# Patient Record
Sex: Female | Born: 1955 | Race: White | Hispanic: No | Smoking: Never smoker
Health system: Southern US, Community
[De-identification: ages and names within clinical notes are randomized; demographics above are authoritative.]

## PROBLEM LIST (undated history)

## (undated) DIAGNOSIS — E785 Hyperlipidemia, unspecified: Secondary | ICD-10-CM

## (undated) DIAGNOSIS — E119 Type 2 diabetes mellitus without complications: Secondary | ICD-10-CM

## (undated) DIAGNOSIS — I10 Essential (primary) hypertension: Secondary | ICD-10-CM

## (undated) DIAGNOSIS — G5600 Carpal tunnel syndrome, unspecified upper limb: Secondary | ICD-10-CM

## (undated) DIAGNOSIS — K219 Gastro-esophageal reflux disease without esophagitis: Secondary | ICD-10-CM

## (undated) HISTORY — PX: COLONOSCOPY: SHX174

## (undated) HISTORY — PX: TONSILLECTOMY: SUR1361

## (undated) HISTORY — PX: UPPER GASTROINTESTINAL ENDOSCOPY: SHX188

---

## 1997-08-11 ENCOUNTER — Ambulatory Visit (HOSPITAL_COMMUNITY): Admission: RE | Admit: 1997-08-11 | Discharge: 1997-08-11 | Payer: Self-pay | Admitting: Obstetrics and Gynecology

## 1998-04-17 ENCOUNTER — Other Ambulatory Visit: Admission: RE | Admit: 1998-04-17 | Discharge: 1998-04-17 | Payer: Self-pay | Admitting: Obstetrics and Gynecology

## 1998-05-27 HISTORY — PX: TOTAL ABDOMINAL HYSTERECTOMY W/ BILATERAL SALPINGOOPHORECTOMY: SHX83

## 1998-07-03 ENCOUNTER — Inpatient Hospital Stay (HOSPITAL_COMMUNITY): Admission: RE | Admit: 1998-07-03 | Discharge: 1998-07-05 | Payer: Self-pay | Admitting: Obstetrics and Gynecology

## 1999-08-14 ENCOUNTER — Other Ambulatory Visit: Admission: RE | Admit: 1999-08-14 | Discharge: 1999-08-14 | Payer: Self-pay | Admitting: Obstetrics and Gynecology

## 1999-10-10 ENCOUNTER — Encounter: Admission: RE | Admit: 1999-10-10 | Discharge: 2000-01-08 | Payer: Self-pay | Admitting: *Deleted

## 2000-10-28 ENCOUNTER — Other Ambulatory Visit: Admission: RE | Admit: 2000-10-28 | Discharge: 2000-10-28 | Payer: Self-pay | Admitting: Obstetrics and Gynecology

## 2001-05-27 HISTORY — PX: DIAGNOSTIC LAPAROSCOPY: SUR761

## 2001-08-27 ENCOUNTER — Inpatient Hospital Stay (HOSPITAL_COMMUNITY): Admission: RE | Admit: 2001-08-27 | Discharge: 2001-09-01 | Payer: Self-pay | Admitting: Obstetrics and Gynecology

## 2002-07-06 ENCOUNTER — Other Ambulatory Visit: Admission: RE | Admit: 2002-07-06 | Discharge: 2002-07-06 | Payer: Self-pay | Admitting: Obstetrics and Gynecology

## 2003-07-26 ENCOUNTER — Other Ambulatory Visit: Admission: RE | Admit: 2003-07-26 | Discharge: 2003-07-26 | Payer: Self-pay | Admitting: Obstetrics and Gynecology

## 2004-03-21 ENCOUNTER — Encounter: Admission: RE | Admit: 2004-03-21 | Discharge: 2004-03-21 | Payer: Self-pay | Admitting: Gastroenterology

## 2004-04-04 ENCOUNTER — Ambulatory Visit (HOSPITAL_COMMUNITY): Admission: RE | Admit: 2004-04-04 | Discharge: 2004-04-04 | Payer: Self-pay | Admitting: Gastroenterology

## 2004-04-04 ENCOUNTER — Encounter (INDEPENDENT_AMBULATORY_CARE_PROVIDER_SITE_OTHER): Payer: Self-pay | Admitting: *Deleted

## 2009-07-18 ENCOUNTER — Encounter: Payer: Self-pay | Admitting: Cardiology

## 2009-07-24 ENCOUNTER — Encounter: Payer: Self-pay | Admitting: Cardiology

## 2009-07-27 ENCOUNTER — Ambulatory Visit: Payer: Self-pay | Admitting: Cardiology

## 2009-07-27 DIAGNOSIS — I10 Essential (primary) hypertension: Secondary | ICD-10-CM | POA: Insufficient documentation

## 2009-07-27 DIAGNOSIS — R079 Chest pain, unspecified: Secondary | ICD-10-CM | POA: Insufficient documentation

## 2009-07-27 DIAGNOSIS — E785 Hyperlipidemia, unspecified: Secondary | ICD-10-CM

## 2009-08-23 ENCOUNTER — Ambulatory Visit (HOSPITAL_COMMUNITY): Admission: RE | Admit: 2009-08-23 | Discharge: 2009-08-23 | Payer: Self-pay | Admitting: Cardiology

## 2009-08-23 ENCOUNTER — Ambulatory Visit: Payer: Self-pay | Admitting: Cardiology

## 2009-08-23 ENCOUNTER — Encounter: Payer: Self-pay | Admitting: Cardiology

## 2009-08-23 ENCOUNTER — Ambulatory Visit: Payer: Self-pay

## 2009-08-23 ENCOUNTER — Ambulatory Visit: Payer: Self-pay | Admitting: Internal Medicine

## 2010-06-28 NOTE — Letter (Signed)
Summary: [facility] Office Note  [facility] Office Note   Imported By: Roderic Ovens 08/11/2009 15:24:14  _____________________________________________________________________  External Attachment:    Type:   Image     Comment:   External Document

## 2010-06-28 NOTE — Assessment & Plan Note (Signed)
Summary: f/u stress echo   Primary Provider:  Dr. Wylene Simmer   History of Present Illness: 55 yo with history of HTN, diabetes, and hyperlipidemia presented initially for cardiac evaluation.  Patient had not been on a statin but recent labs showed LDL 175 and triglyerices 343, so she was started recently on pitavastatin, which she has tolerated well so far.  BP is well-controlled.  Patient walks for exercise.  With brisk walking she is short of breath.  No chest pain or tightness.  No shortness of breath with steps. Nonsmoker.  Of note, patient's father had an MI in his 29s.    Given risk factors and some mild exertional dyspnea, I had her do a stress echo for risk stratification, which was a normal study.   Labs (2/11): K 3.8, creatinine 0.9, TGs 343, LDL 175, HDL 41  Current Medications (verified): 1)  Hydrochlorothiazide 12.5 Mg Caps (Hydrochlorothiazide) .... Take One Capsule Once Daily 2)  Ramipril 10 Mg Caps (Ramipril) .... Take One Capsule Daily 3)  Lantus 100 Unit/ml Soln (Insulin Glargine) .... Use 40 Units Daily 4)  Byetta 10 Mcg Pen 10 Mcg/0.63ml Soln (Exenatide) .... Two Times A Day 5)  Livalo 4 Mg Tabs (Pitavastatin Calcium) .... One Tablet Daily 6)  Fish Oil 2100mg  .... One Daily 7)  Aspir-Low 81 Mg Tbec (Aspirin) .... One Daily 8)  Prevacid 24hr 15 Mg Cpdr (Lansoprazole) .... One Daily  Allergies: No Known Drug Allergies  Past History:  Past Medical History: 1. GERD 2. Diabetes mellitus 3. Hyperlipidemia 4. HTN 5. FIbrocystic breast disease 6. Colonic adenoma 7. TAH/BSO 2000 (fibroids) 8. Obesity 9. Stress Echo (3/11): Normal augmentation of all walls with stress, no indication of ischemia or infarction.   Family History: Reviewed history from 07/27/2009 and no changes required. Father with MI in his 54s.   Social History: Reviewed history from 07/27/2009 and no changes required. Divorced.  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   Lives in [place], works in [place].   Vital Signs:  Patient profile:   55 year old female Height:      67 inches Weight:      248.75 pounds Pulse (ortho):   80 / minute BP sitting:   126 / 78  (left arm)  Vitals Entered By: Katina Dung, RN, BSN (August 23, 2009 11:53 AM)  Physical Exam  General:  Well developed, well nourished, in no acute distress.  Obese.  Neck:  Neck supple, no JVD. No masses, thyromegaly or abnormal cervical nodes. Lungs:  Clear bilaterally to auscultation and percussion. Heart:  Non-displaced PMI, chest non-tender; regular rate and rhythm, S1, S2 without murmurs, rubs or gallops. Carotid upstroke normal, no bruit. Pedals normal pulses. No edema, no varicosities. Abdomen:  Bowel sounds positive; abdomen soft and non-tender without masses, organomegaly, or hernias noted. No hepatosplenomegaly. Extremities:  No clubbing or cyanosis. Neurologic:  Alert and oriented x 3. Psych:  Normal affect.   Impression & Recommendations:  Problem # 1:  CARDIAC RISK Stress echo was a low risk study, no evidence for ischemia or infarction.  Patient's main risk factors are hyperlipidemia, HTN, and family history of premature CAD.  HTN is well-controlled and patient has started on a statin.   Patient Instructions: 1)  Your physician recommends that you schedule a follow-up appointment as needed with Dr Shirlee Latch.

## 2010-06-28 NOTE — Assessment & Plan Note (Signed)
Summary: np6/cardiac eval/hx of premature CAD/Early family hx/jml   Primary Provider:  Dr. Wylene Simmer  CC:  new patient cardiac evaluation/hx of premature CAD in family.  Marland Kitchen  History of Present Illness: 55 yo with history of HTN, diabetes, and hyperlipidemia presents for cardiac evaluation.  Patient had not been on a statin but recent labs showed LDL 175 and triglyerices 343, so she as been started recently on Tricor and pitivastatin.  Blood pressure is reasonably controlled.  Patient walks for exercise.  With brisk walking she is short of breath.  No chest pain or tightness.  No shortness of breath with steps. Nonsmoker.  Of note, patient's father had an MI in his 64s.    ECG: NSR, poor anterior R wave progression.  Labs (2/11): K 3.8, creatinine 0.9, TGs 343, LDL 175, HDL 41   Current Medications (verified): 1)  Hydrochlorothiazide 12.5 Mg Caps (Hydrochlorothiazide) .... Take One Capsule Once Daily 2)  Ramipril 10 Mg Caps (Ramipril) .... Take One Capsule Daily 3)  Lantus 100 Unit/ml Soln (Insulin Glargine) .... Use 40 Units Daily 4)  Byetta 10 Mcg Pen 10 Mcg/0.77ml Soln (Exenatide) .... Two Times A Day 5)  Tricor 145 Mg Tabs (Fenofibrate) .... Take One Tablet Once Daily 6)  Livalo 2 Mg Tabs (Pitavastatin Calcium) .... Once Daily  Allergies (verified): No Known Drug Allergies  Past History:  Past Medical History: 1. GERD 2. Diabetes mellitus 3. Hyperlipidemia 4. HTN 5. FIbrocystic breast disease 6. Colonic adenoma 7. TAH/BSO 2000 (fibroids) 8. Obesity  Family History: Father with MI in his 30s.   Social History: Divorced.  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  Lives in [place], works in [place].   Review of Systems       All systems reviewed and negative except as per HPI.   Vital Signs:  Patient profile:   55 year old female Height:      67 inches Weight:      251 pounds BMI:     39.45 Pulse rate:   78 / minute Pulse rhythm:    regular BP sitting:   135 / 84  (left arm) Cuff size:   large  Vitals Entered By: Judithe Modest CMA (July 27, 2009 10:13 AM)  Physical Exam  General:  Well developed, well nourished, in no acute distress.  Obese.  Head:  normocephalic and atraumatic Nose:  no deformity, discharge, inflammation, or lesions Mouth:  Teeth, gums and palate normal. Oral mucosa normal. Neck:  Neck supple, no JVD. No masses, thyromegaly or abnormal cervical nodes. Lungs:  Clear bilaterally to auscultation and percussion. Heart:  Non-displaced PMI, chest non-tender; regular rate and rhythm, S1, S2 without murmurs, rubs or gallops. Carotid upstroke normal, no bruit. Pedals normal pulses. No edema, no varicosities. Abdomen:  Bowel sounds positive; abdomen soft and non-tender without masses, organomegaly, or hernias noted. No hepatosplenomegaly. Msk:  Back normal, normal gait. Muscle strength and tone normal. Extremities:  No clubbing or cyanosis. Neurologic:  Alert and oriented x 3. Skin:  Intact without lesions or rashes. Psych:  Normal affect.   Impression & Recommendations:  Problem # 1:  CORONARY DISEASE RISK Patient has a number of risk factors for CAD: HTN, hyperlipidemia, diabetes, and family history of premature CAD. She should take ASA 81 mg daily (which I recommended to her).  She is short of breath with more heavy exertion (walking fast).  Given her risk factors, I think a stress echo for risk stratification  would be reasonable.   Problem # 2:  HYPERLIPIDEMIA-MIXED (ICD-272.4) Start Tricor and pitivastatin per Dr. Wylene Simmer.  I also suggested 2000 mg fish oil daily.   Problem # 3:  HYPERTENSION, UNSPECIFIED (ICD-401.9) Continue HCTZ and ramipril.  Reasonable numbers today.   Other Orders: Stress Echo (Stress Echo)  Patient Instructions: 1)  Your physician has recommended you make the following change in your medication:   2)  Start 81 mg ASA; take 1 tablet once daily  3)  Start fish oil 1000  mg; take 1 capsule two times a day  4)  Your physician has requested that you have a stress echocardiogram. For further information please visit [email or web address]  Please follow instruction sheet as given. 5)  Schedule follow up appt with Dr. Shirlee Latch following Echo

## 2010-10-12 NOTE — Discharge Summary (Signed)
[facility]  Patient:    Erin Watts, Erin Watts Visit Number: [id number] MRN: [id number]          Service Type: GYN Location: 9300 [id number] Attending Physician:  Osborn Coho Dictated by:   Janeece Riggers Dareen Piano, M.D. Admit Date:  08/27/2001 Discharge Date: 09/01/2001                             Discharge Summary  DISCHARGE DIAGNOSES:          1. Pelvic pain.                               2. History of endometriosis.                               3. Extensive abdominal and pelvic adhesions.                               4. Laceration from trocar to small bowel.  PROCEDURES:                   1. Diagnostic laparoscopy.                               2. Exploratory laparotomy with enterolysis.                               3. Repair of trocar injury to ileum.  HISTORY OF PRESENT ILLNESS:   [name] is a 55 year old white female who presented to the hospital for diagnostic laparoscopy secondary to a one-year history of worsening pelvic pain.  The patient has a history of hysterectomy. During that time, endometriosis was discovered.  The patient did well from her hysterectomy until approximately seven to eight months ago when she began to develop pelvic pain.  At that time, the patient was given a course of Lupron which did not change her symptoms.  In light of this, she was taken to the operating room for a diagnostic laparoscopy.  For more detailed description of the events which led up to this can be found in the dictated History & Physical.  HOSPITAL COURSE:              The patient was undergoing a diagnostic laparoscopy when she was discovered to have an injury to bowel from a trocar placement.  Because of this, the patient had vertical skin incision made and exploratory laparotomy performed.  There were extensive adhesions involving the large and small bowel throughout the abdomen and pelvis.  These were all taken down with sharp dissection.  A  general surgeon was called.  Dr. Ezzard Standing presented to the operating room and repaired the laceration and also completed the enterolysis.  Once this was accomplished, Dr. Ezzard Standing left, and the vertical skin incision was closed using a running Smead-Jones, and staples were used to close the skin.  The patient did well postoperatively.  Initially and NG tube was placed.  Once bowel sounds were heard, she was started on a clear liquid diet.  The patient was following the diet well, and on postoperative day #6, the patient was afebrile.  She was discharged  to home and instructed to follow up in the office in two weeks.Dictated by:   Janeece Riggers Dareen Piano, M.D. Attending Physician:  Osborn Coho DD:  09/22/01 TD:  09/23/01 Job: [id number]

## 2010-10-12 NOTE — H&P (Signed)
[facility]  Patient:    Erin Watts, Erin Watts Visit Number: [id number] MRN: [id number]          Service Type: GYN Location: 9300 9313 01 Attending Physician:  Osborn Coho Dictated by:   Janeece Riggers Dareen Piano, M.D. Admit Date:  08/27/2001                           History and Physical  HISTORY OF PRESENT ILLNESS:   [name] is a 55 year old white female G0, P0 status post total abdominal hysterectomy with bilateral salpingo-oophorectomy who presented to the operative suite for a diagnostic laparoscopy, possible laparotomy secondary to chronic pelvic pain with associated low-back pain. The patient underwent total abdominal hysterectomy with bilateral salpingo-oophorectomy and Burch urethropexy in February 2000 secondary to symptomatic uterine fibroids and stress urinary incontinence.  At the time, the patient was found to have endometriosis which was treated.  The amount of endometriosis was minimal.  It was on the left pelvic sidewall. Postoperatively, the patient did well for approximately 7-8 months, at which time she began complaining of lower abdominal pain and left lower quadrant pain.  The patient was felt to have a recurrence of her endometriosis and her hormone replacement therapy was discontinued.  She was also put on Lupron for six months.  The patient had minimal improvement with this and returned to the office in March 2003 complaining of worsening of the lower abdominal pain and low-back pain.  She also states that she feels like she is going to start her menstrual cycle.  She has also dyspareunia.  She denies any abnormal bowel function.  The patient also states she occasionally gets pain going down her left lower extremity.  The patient did have a trial of ______, which made no difference in her symptoms.  It was felt that the patient possibly has deep spots of endometriosis or pelvic adhesions creating her complications.   The patient understands that she may have to undergo exploratory laparotomy if the endometriosis is impossible to get to through the laparoscopy.  PAST MEDICAL HISTORY:         Surgery as mentioned above.  The patient also has a history of type 2 diabetes.  ALLERGIES:                    She has no known medical allergies.  SOCIAL HISTORY:               She denies smoking.  She uses alcohol occasionally.  FAMILY HISTORY:               Significant for colon carcinoma.  PHYSICAL EXAMINATION:  VITAL SIGNS:                  The patients vital signs are stable and she is afebrile.  HEENT:                        Within normal limits.  LUNGS:                        Clear to auscultation.  CARDIOVASCULAR:               Regular rate and rhythm without murmur.  BREASTS:                      Without masses or tenderness.  No  lymphadenopathy.  ABDOMEN:                      Soft, nontender, nondistended.  There is no organomegaly.  There is no rebound or guarding.  EXTREMITIES:                  Within normal limits.  PELVIC:                       Exam reveals a normal external female genitalia. The vagina is without discharge or lesions.  There is some tenderness at the cuff.  No masses are palpated.  IMPRESSION:                   Chronic pelvic pain, possible recurrence of endometriosis.  PLAN:                         Proceed with diagnostic laparoscopy, possible exploratory laparotomy. Dictated by:   Janeece Riggers Dareen Piano, M.D. Attending Physician:  Osborn Coho DD:  08/27/01 TD:  08/27/01 Job: [id number]

## 2010-10-12 NOTE — Op Note (Signed)
[facility]  Patient:    Erin Watts, Erin Watts Visit Number: [id number] MRN: [id number]          Service Type: GYN Location: 9300 9313 01 Attending Physician:  Osborn Coho Dictated by:   Janeece Riggers Dareen Piano, M.D. Proc. Date: 08/27/01 Admit Date:  08/27/2001   CC:         Sandria Bales. Ezzard Standing, M.D.   Operative Report  PREOPERATIVE DIAGNOSES:       1. Pelvic pain.                               2. History of endometriosis.                               3. Laceration of ileum with trocar.  POSTOPERATIVE DIAGNOSES:      1. Pelvic pain.                               2. History of endometriosis.                               3. Laceration of ileum with trocar.  PROCEDURE:                    1. Diagnostic laparoscopy.                               2. Lysis of adhesions.                               3. Exploratory laparotomy.                               4. Repair of laceration of ileum.  SURGEON:                      Mark E. Dareen Piano, M.D., Brook A. Edward Jolly, M.D., Ovidio Kin, M.D.  ANESTHESIA:                   General endotracheal.  ANTIBIOTICS:                  Ancef 1 g, Flagyl 2 g IV piggyback.  ESTIMATED BLOOD LOSS:         150 cc.  DRAINS:                       Foley to bed side drainage.  COMPLICATIONS:                Patient had injury to her ileum approximately 18 inches from the cecum.  This was made with a 10 mm trocar.  PROCEDURE:                    Patient was taken to the operating room where she was placed in a dorsal supine position.  A general anesthetic was administered without complications.  She was then placed in a dorsal lithotomy position and prepped with Hibiclens.  Her bladder was drained with a Red rubber catheter.  Her umbilicus was  then injected with 0.25% Marcaine.  A vertical skin incision was made in the umbilicus.  A Veress needle was placed into the peritoneal cavity and 3 L of carbon dioxide was then placed.   The trocar was then placed in the peritoneal cavity.  On attempt to place the 10 mm trocar in the peritoneal cavity was unsuccessful.  It was felt that the fascia had been perforated; however, we were unable to get into the peritoneal cavity.  At this point the scope was placed through the trocar sheath and small window in the perineum was found where the scope was placed through the peritoneum and into the peritoneal cavity.  At this point there were extensive adhesions found throughout the entire abdominal cavity.  These were taken down with sharp dissection.  Patient was found to have an injury to her bowel at the trocar site.  No stool was seen coming from this; however, mucosa was visualized.  It was impossible to tell whether the patients large or small bowel had been injured.  Because of this the patient was set up for an exploratory laparotomy.  The scope was removed and pneumoperitoneum released and the fascia closed with 0 Vicryl suture and the skin closed with 4-0 Vicryl suture.  At this point a vertical skin incision was made.  This was carried up to just below the umbilicus.  The fascia was then entered in the midline and taken superiorly and inferiorly.  The parietoperitoneum was entered sharply and omental adhesions taken down with the Bovie.  At this point adhesions involving the bowel were taken down with the Metzenbaum scissors.  The area of injury was isolated and adhesions involving this portion of the bowel were all taken down with sharp dissection.  Once this was accomplished it was found that the injury was in the ileum approximately 18 inches from the cecum. General surgeon was contacted to verify this and Dr. Ezzard Standing presented to the operating room.  An OConnor-O-Sullivan retractor had been placed and the bowel run in both directions.  No other injuries were identified.  When Dr. Ezzard Standing arrived the injury was closed with interrupted 3-0 silk sutures.  A complete  description of this can be found in his dictated operative note. Following this extensive adhesions in the pelvic cavity were taken down with Metzenbaum scissors by Dr. Ezzard Standing.  No evidence of endometriosis was found throughout the entire pelvis or abdominal cavity.  The upper abdominal cavity was also explored and found to be normal.  This concluded the procedure. Patient did have an NG tube placed just prior to exploratory laparotomy along with a Foley catheter.  The patient was also given 2 g of Flagyl once the injury was noted.  The fascia and peritoneum were closed using 0 PDS suture in a running Smead Jones fashion.  Subcuticular tissue was closed using interrupted 2-0 clip sutures.  Stainless steel clips were used to close the skin.  The patient tolerated her procedure well.  She was taken to the recovery room in stable condition.  She will be admitted and begun on Cefotan. She will be made n.p.o. Dictated by:   Janeece Riggers. Dareen Piano, M.D. Attending Physician:  Osborn Coho DD:  08/27/01 TD:  08/27/01 Job: [id number]

## 2010-10-12 NOTE — Op Note (Signed)
[facility]  Patient:    Erin Watts, Erin Watts Visit Number: [id number] MRN: [id number]          Service Type: GYN Location: 9300 9313 01 Attending Physician:  Osborn Coho Dictated by:   Sandria Bales. Ezzard Standing, M.D. Proc. Date: 08/27/01 Admit Date:  08/27/2001   CC:         Loraine Leriche E. Dareen Piano, M.D.   Operative Report  DATE OF BIRTH:  02/11/1956  PREOPERATIVE DIAGNOSIS:  Small bowel enterotomy.  POSTOPERATIVE DIAGNOSIS:  Small bowel enterotomy.  Intra-abdominal adhesions.  OPERATION PERFORMED:  Closure of small bowel enterotomy and lysis of pelvic adhesions.  SURGEON:  Sandria Bales. Ezzard Standing, M.D.  ASSISTANT:  Janeece Riggers. Dareen Piano, M.D.  ANESTHESIA:  General endotracheal.  INDICATIONS FOR PROCEDURE:  The patient is a 55 year old female who was undergoing a laparoscopy by Dr. Malva Limes for left-sided and pelvic pain. She had a prior transabdominal hysterectomy.  Upon entering the bowel, there was an injury to the small bowel with the trocar.  I was consulted intraoperatively.  Dr. Dareen Piano had already done open laparotomy on the patient and identified the injury.  DESCRIPTION OF PROCEDURE:  At about 18 inches from the terminal ileum the patient had about a 1.5 cm tear in the small bowel with a hole in to the bowel.  The bowel looked viable, did not look overly injured and I closed this hole with interrupted 3-0 silk sutures.  Also I buttressed an area right adjacent to it where there was a small serosal tear.  We then ran the bowel down to the terminal ileum which was unremarkable and back up to the jejunum. The remainder of the bowel was free of any adhesions.  She also had some adhesions of the sigmoid colon down along the left colonic gutter.  We took these down looking for any evidence of endometriosis or other abnormality and we found none.  The sigmoid colon coiled actually back on itself a little bit in the pelvis.  At this point I  completed my part of the procedure.  Dr. Dareen Piano was going to close the abdomen.  I will follow the patient in the hospital.  We will keep her n.p.o. for at least a day or two to make sure she has no problems with this. Dictated by:   Sandria Bales. Ezzard Standing, M.D. Attending Physician:  Osborn Coho DD:  08/27/01 TD:  08/27/01 Job: [id number]

## 2010-10-12 NOTE — Op Note (Signed)
[name], Erin Watts                ACCOUNT NO.:  [id number]   MEDICAL RECORD NO.:  [id number]          PATIENT TYPE:  AMB   LOCATION:  ENDO                         FACILITY:  [facility]   PHYSICIAN:  Erin Watts, M.D.    DATE OF BIRTH:  05/09/1956   DATE OF PROCEDURE:  DATE OF DISCHARGE:                                 OPERATIVE REPORT   DATE OF PROCEDURE:  April 04, 2004.   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  Consent was signed after risks, benefits, methods, options  thoroughly discussed in the office.   MEDICINES USES:  Demerol 90, Versed 9.   ENDOSCOPIST:  Erin Kuba, MD.   PROCEDURE:  Rectal inspection was pertinent for external hemorrhoids, small.  Digital exam was negative.  Video colonoscope was inserted and with  abdominal pressure and rolling her around on her back, we were easily able  to advance to the cecum.  On insertion, no obvious abnormalities were seen.  The cecum was identified by the appendiceal orifice and the ileocecal valve.  In fact, the scope was inserted a short ways into the terminal ileum, which  was normal.  Photo documentation was obtained, and the scope was slowly  withdrawn.  Prep was adequate.  No abnormality was seen in the cecum and the  ascending.  There was some liquid stool that required washing and  suctioning.  On slow withdrawal in the more proximal transverse, a small,  sessile polyp was seen and was hot biopsied x3 and put in the first  container.  As the scope was slowly withdrawn, another two transverse tiny  to small polyps were seen as well as a few descending and sigmoid, and they  were all hot biopsied and put in the second container.  There was a rare  left-sided diverticula, but no other abnormalities.  Once back in the  rectum, two  small polyps were seen, and both were snared.  One,  unfortunately, the snare cut through the polyp.  Both polyps were suctioned  through the scope and collected in the trap, but the one that we cut  through, we hot biopsied the base with good cessation of bleeding.  The  other polyp was snared in the usual fashion, electrocautery applied, and the  polyp removed.  There was a nice, white __________without any obvious  residual bleeding.  Anorectal pull-through and retroflexion confirms some  small hemorrhoids.  The scope was straightened and readvanced a short ways  up the left side of the colon.  The rectal polyps were put in a third  container, air was suctioned, the scope removed.  The patient tolerated the  procedure well.  There was no obvious immediate complication.   ENDOSCOPIC DIAGNOSES:  1.  Internal and external hemorrhoids.  2.  Rare left-sided diverticula.  3.  Two rectal polyps snared, and one hot biopsied at the base and  put in      container #3.  4.  One small proximal transverse polyp, sessile, 3 hot biopsies, put in the      first container.  5.  Multiple, tiny, transverse, descending and sigmoid polyps, hot biopsied      and put in container #2.  6.  Otherwise within normal limits to the terminal ileum.   PLAN:  Await pathology to determine future colonic screening.  Consider a  laparoscope by Dr. Dareen Watts.  Happy to see back p.r.n. or in two or three  months to recheck symptoms to make sure no further workup plans are needed.  Possible other antispasmodic such as Carafate, etc., to help with her  symptoms.       MEM/MEDQ  D:  04/04/2004  T:  04/04/2004  Job:  [id number]   cc:   Erin Watts, M.D.  [address]  [place]  [place] [address]  Fax: [phone]   Erin Watts, M.D.  [address]  [place]  [place] [address]  Fax: [phone]

## 2011-06-06 ENCOUNTER — Ambulatory Visit (INDEPENDENT_AMBULATORY_CARE_PROVIDER_SITE_OTHER): Payer: BC Managed Care – PPO | Admitting: *Deleted

## 2011-06-06 DIAGNOSIS — M7989 Other specified soft tissue disorders: Secondary | ICD-10-CM

## 2011-06-07 ENCOUNTER — Encounter: Payer: Self-pay | Admitting: Family Medicine
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
## 2012-05-14 ENCOUNTER — Encounter (INDEPENDENT_AMBULATORY_CARE_PROVIDER_SITE_OTHER): Payer: BC Managed Care – PPO | Admitting: *Deleted

## 2012-05-14 DIAGNOSIS — R0989 Other specified symptoms and signs involving the circulatory and respiratory systems: Secondary | ICD-10-CM

## 2013-01-06 ENCOUNTER — Other Ambulatory Visit: Payer: Self-pay | Admitting: Internal Medicine

## 2013-01-06 DIAGNOSIS — Z1231 Encounter for screening mammogram for malignant neoplasm of breast: Secondary | ICD-10-CM

## 2013-01-14 ENCOUNTER — Ambulatory Visit
Admission: RE | Admit: 2013-01-14 | Discharge: 2013-01-14 | Disposition: A | Discharge status: Home / Self Care | Payer: BC Managed Care – PPO | Source: Ambulatory Visit | Attending: Internal Medicine | Admitting: Internal Medicine

## 2013-01-14 DIAGNOSIS — Z1231 Encounter for screening mammogram for malignant neoplasm of breast: Secondary | ICD-10-CM

## 2013-03-23 ENCOUNTER — Other Ambulatory Visit: Payer: Self-pay | Admitting: Orthopedic Surgery

## 2013-03-25 ENCOUNTER — Encounter (HOSPITAL_BASED_OUTPATIENT_CLINIC_OR_DEPARTMENT_OTHER): Payer: Self-pay | Admitting: *Deleted

## 2013-03-25 NOTE — Progress Notes (Signed)
Will come in for bmet-ekg-n cardiac problems

## 2013-03-25 NOTE — Progress Notes (Signed)
03/25/13 1655  OBSTRUCTIVE SLEEP APNEA  Have you ever been diagnosed with sleep apnea through a sleep study? No  Do you snore loudly (loud enough to be heard through closed doors)?  1  Do you often feel tired, fatigued, or sleepy during the daytime? 0  Has anyone observed you stop breathing during your sleep? 0  Do you have, or are you being treated for high blood pressure? 1  BMI more than 35 kg/m2? 1  Age over 58 years old? 1  Gender: 0  Obstructive Sleep Apnea Score 4  Score 4 or greater  Results sent to PCP

## 2013-03-29 ENCOUNTER — Encounter (HOSPITAL_BASED_OUTPATIENT_CLINIC_OR_DEPARTMENT_OTHER)
Admission: RE | Admit: 2013-03-29 | Discharge: 2013-03-29 | Disposition: A | Discharge status: Home / Self Care | Payer: BC Managed Care – PPO | Source: Ambulatory Visit | Attending: Orthopedic Surgery | Admitting: Orthopedic Surgery

## 2013-03-29 LAB — BASIC METABOLIC PANEL
BUN: 17 mg/dL (ref 6–23)
CO2: 27 mEq/L (ref 19–32)
Calcium: 9.4 mg/dL (ref 8.4–10.5)
Chloride: 105 mEq/L (ref 96–112)
Creatinine, Ser: 0.91 mg/dL (ref 0.50–1.10)
GFR calc Af Amer: 79 mL/min — ABNORMAL LOW (ref >90)
GFR calc non Af Amer: 68 mL/min — ABNORMAL LOW (ref >90)
Glucose, Bld: 170 mg/dL — ABNORMAL HIGH (ref 70–99)
Potassium: 3.8 mEq/L (ref 3.5–5.1)
Sodium: 142 mEq/L (ref 135–145)

## 2013-03-31 ENCOUNTER — Encounter (HOSPITAL_BASED_OUTPATIENT_CLINIC_OR_DEPARTMENT_OTHER)
Admission: RE | Disposition: A | Discharge status: Home / Self Care | Payer: Self-pay | Source: Ambulatory Visit | Attending: Orthopedic Surgery

## 2013-03-31 ENCOUNTER — Encounter (HOSPITAL_BASED_OUTPATIENT_CLINIC_OR_DEPARTMENT_OTHER): Payer: BC Managed Care – PPO | Admitting: Anesthesiology

## 2013-03-31 ENCOUNTER — Ambulatory Visit (HOSPITAL_BASED_OUTPATIENT_CLINIC_OR_DEPARTMENT_OTHER)
Admission: RE | Admit: 2013-03-31 | Discharge: 2013-03-31 | Disposition: A | Discharge status: Home / Self Care | Payer: BC Managed Care – PPO | Source: Ambulatory Visit | Attending: Orthopedic Surgery | Admitting: Orthopedic Surgery

## 2013-03-31 ENCOUNTER — Ambulatory Visit (HOSPITAL_BASED_OUTPATIENT_CLINIC_OR_DEPARTMENT_OTHER): Payer: BC Managed Care – PPO | Admitting: Anesthesiology

## 2013-03-31 ENCOUNTER — Encounter (HOSPITAL_BASED_OUTPATIENT_CLINIC_OR_DEPARTMENT_OTHER): Payer: Self-pay | Admitting: Orthopedic Surgery

## 2013-03-31 DIAGNOSIS — M6740 Ganglion, unspecified site: Secondary | ICD-10-CM | POA: Insufficient documentation

## 2013-03-31 DIAGNOSIS — M65839 Other synovitis and tenosynovitis, unspecified forearm: Secondary | ICD-10-CM | POA: Insufficient documentation

## 2013-03-31 DIAGNOSIS — E119 Type 2 diabetes mellitus without complications: Secondary | ICD-10-CM | POA: Insufficient documentation

## 2013-03-31 DIAGNOSIS — R29898 Other symptoms and signs involving the musculoskeletal system: Secondary | ICD-10-CM | POA: Insufficient documentation

## 2013-03-31 DIAGNOSIS — M129 Arthropathy, unspecified: Secondary | ICD-10-CM | POA: Insufficient documentation

## 2013-03-31 DIAGNOSIS — K219 Gastro-esophageal reflux disease without esophagitis: Secondary | ICD-10-CM | POA: Insufficient documentation

## 2013-03-31 DIAGNOSIS — I10 Essential (primary) hypertension: Secondary | ICD-10-CM | POA: Insufficient documentation

## 2013-03-31 DIAGNOSIS — M6530 Trigger finger, unspecified finger: Secondary | ICD-10-CM | POA: Insufficient documentation

## 2013-03-31 DIAGNOSIS — I519 Heart disease, unspecified: Secondary | ICD-10-CM | POA: Insufficient documentation

## 2013-03-31 DIAGNOSIS — G5600 Carpal tunnel syndrome, unspecified upper limb: Secondary | ICD-10-CM | POA: Insufficient documentation

## 2013-03-31 DIAGNOSIS — E785 Hyperlipidemia, unspecified: Secondary | ICD-10-CM | POA: Insufficient documentation

## 2013-03-31 HISTORY — DX: Essential (primary) hypertension: I10

## 2013-03-31 HISTORY — PX: TRIGGER FINGER RELEASE: SHX641

## 2013-03-31 HISTORY — DX: Type 2 diabetes mellitus without complications: E11.9

## 2013-03-31 HISTORY — DX: Hyperlipidemia, unspecified: E78.5

## 2013-03-31 HISTORY — DX: Carpal tunnel syndrome, unspecified upper limb: G56.00

## 2013-03-31 HISTORY — DX: Gastro-esophageal reflux disease without esophagitis: K21.9

## 2013-03-31 HISTORY — PX: CARPAL TUNNEL RELEASE: SHX101

## 2013-03-31 LAB — GLUCOSE, CAPILLARY: Glucose-Capillary: 144 mg/dL — ABNORMAL HIGH (ref 70–99)

## 2013-03-31 SURGERY — CARPAL TUNNEL RELEASE
Anesthesia: General | Site: Wrist | Laterality: Bilateral | Wound class: Clean

## 2013-03-31 MED ORDER — PROPOFOL 10 MG/ML IV BOLUS
INTRAVENOUS | Status: DC | PRN
  Administered 2013-03-31: 300 mg via INTRAVENOUS

## 2013-03-31 MED ORDER — CHLORHEXIDINE GLUCONATE 4 % EX LIQD: 60.0000 mL | Freq: Once | CUTANEOUS | Status: DC

## 2013-03-31 MED ORDER — HYDROMORPHONE HCL PF 1 MG/ML IJ SOLN
0.2500 mg | INTRAMUSCULAR | Status: DC | PRN
  Administered 2013-03-31: 0.5 mg via INTRAVENOUS

## 2013-03-31 MED ORDER — FENTANYL CITRATE 0.05 MG/ML IJ SOLN
INTRAMUSCULAR | Status: AC
  Filled 2013-03-31: qty 4

## 2013-03-31 MED ORDER — MIDAZOLAM HCL 2 MG/2ML IJ SOLN
INTRAMUSCULAR | Status: AC
  Filled 2013-03-31: qty 2

## 2013-03-31 MED ORDER — OXYCODONE HCL 5 MG/5ML PO SOLN: 5.0000 mg | Freq: Once | ORAL | Status: AC | PRN

## 2013-03-31 MED ORDER — CEFAZOLIN SODIUM-DEXTROSE 2-3 GM-% IV SOLR
INTRAVENOUS | Status: AC
  Filled 2013-03-31: qty 50

## 2013-03-31 MED ORDER — CEFAZOLIN SODIUM-DEXTROSE 2-3 GM-% IV SOLR
2.0000 g | INTRAVENOUS | Status: AC
  Administered 2013-03-31: 3 g via INTRAVENOUS

## 2013-03-31 MED ORDER — EPHEDRINE SULFATE 50 MG/ML IJ SOLN
INTRAMUSCULAR | Status: DC | PRN
  Administered 2013-03-31: 10 mg via INTRAVENOUS

## 2013-03-31 MED ORDER — CEFAZOLIN SODIUM 1-5 GM-% IV SOLN
INTRAVENOUS | Status: AC
  Filled 2013-03-31: qty 50

## 2013-03-31 MED ORDER — HYDROCODONE-ACETAMINOPHEN 5-325 MG PO TABS: 1.0000 | ORAL_TABLET | Freq: Four times a day (QID) | ORAL | Status: AC | PRN

## 2013-03-31 MED ORDER — OXYCODONE HCL 5 MG PO TABS
ORAL_TABLET | ORAL | Status: AC
  Filled 2013-03-31: qty 1

## 2013-03-31 MED ORDER — BUPIVACAINE HCL (PF) 0.25 % IJ SOLN
INTRAMUSCULAR | Status: AC
  Filled 2013-03-31: qty 30

## 2013-03-31 MED ORDER — BUPIVACAINE HCL (PF) 0.25 % IJ SOLN
INTRAMUSCULAR | Status: DC | PRN
  Administered 2013-03-31: 7 mL
  Administered 2013-03-31: 9 mL

## 2013-03-31 MED ORDER — PROPOFOL 10 MG/ML IV BOLUS
INTRAVENOUS | Status: AC
  Filled 2013-03-31: qty 20

## 2013-03-31 MED ORDER — ONDANSETRON HCL 4 MG/2ML IJ SOLN
INTRAMUSCULAR | Status: DC | PRN
  Administered 2013-03-31: 4 mg via INTRAVENOUS

## 2013-03-31 MED ORDER — LIDOCAINE HCL (CARDIAC) 20 MG/ML IV SOLN
INTRAVENOUS | Status: DC | PRN
  Administered 2013-03-31: 80 mg via INTRAVENOUS

## 2013-03-31 MED ORDER — ONDANSETRON HCL 4 MG/2ML IJ SOLN: 4.0000 mg | Freq: Once | INTRAMUSCULAR | Status: DC | PRN

## 2013-03-31 MED ORDER — MIDAZOLAM HCL 5 MG/5ML IJ SOLN
INTRAMUSCULAR | Status: DC | PRN
  Administered 2013-03-31: 2 mg via INTRAVENOUS

## 2013-03-31 MED ORDER — FENTANYL CITRATE 0.05 MG/ML IJ SOLN
INTRAMUSCULAR | Status: DC | PRN
  Administered 2013-03-31: 50 ug via INTRAVENOUS
  Administered 2013-03-31: 100 ug via INTRAVENOUS
  Administered 2013-03-31: 50 ug via INTRAVENOUS

## 2013-03-31 MED ORDER — HYDROMORPHONE HCL PF 1 MG/ML IJ SOLN
INTRAMUSCULAR | Status: AC
  Filled 2013-03-31: qty 1

## 2013-03-31 MED ORDER — OXYCODONE HCL 5 MG PO TABS
5.0000 mg | ORAL_TABLET | Freq: Once | ORAL | Status: AC | PRN
  Administered 2013-03-31: 5 mg via ORAL

## 2013-03-31 MED ORDER — FENTANYL CITRATE 0.05 MG/ML IJ SOLN: 50.0000 ug | INTRAMUSCULAR | Status: DC | PRN

## 2013-03-31 MED ORDER — MIDAZOLAM HCL 2 MG/2ML IJ SOLN: 1.0000 mg | INTRAMUSCULAR | Status: DC | PRN

## 2013-03-31 MED ORDER — LACTATED RINGERS IV SOLN
INTRAVENOUS | Status: DC
  Administered 2013-03-31 (×2): via INTRAVENOUS

## 2013-03-31 MED ORDER — DEXAMETHASONE SODIUM PHOSPHATE 10 MG/ML IJ SOLN
INTRAMUSCULAR | Status: DC | PRN
  Administered 2013-03-31: 10 mg via INTRAVENOUS

## 2013-03-31 SURGICAL SUPPLY — 46 items
ADH SKN CLS APL DERMABOND .7 (GAUZE/BANDAGES/DRESSINGS) ×4
BANDAGE COBAN STERILE 2 (GAUZE/BANDAGES/DRESSINGS) ×4 IMPLANT
BANDAGE GAUZE ELAST BULKY 4 IN (GAUZE/BANDAGES/DRESSINGS) ×2 IMPLANT
BLADE SURG 15 STRL LF DISP TIS (BLADE) ×2 IMPLANT
BLADE SURG 15 STRL SS (BLADE) ×3
BNDG CMPR 9X4 STRL LF SNTH (GAUZE/BANDAGES/DRESSINGS) ×2
BNDG COHESIVE 3X5 TAN STRL LF (GAUZE/BANDAGES/DRESSINGS) ×2 IMPLANT
BNDG ESMARK 4X9 LF (GAUZE/BANDAGES/DRESSINGS) ×1 IMPLANT
CHLORAPREP W/TINT 26ML (MISCELLANEOUS) ×4 IMPLANT
CORDS BIPOLAR (ELECTRODE) ×3 IMPLANT
COVER MAYO STAND STRL (DRAPES) ×4 IMPLANT
COVER TABLE BACK 60X90 (DRAPES) ×3 IMPLANT
CUFF TOURNIQUET SINGLE 18IN (TOURNIQUET CUFF) ×4 IMPLANT
DECANTER SPIKE VIAL GLASS SM (MISCELLANEOUS) IMPLANT
DERMABOND ADVANCED (GAUZE/BANDAGES/DRESSINGS) ×2
DERMABOND ADVANCED .7 DNX12 (GAUZE/BANDAGES/DRESSINGS) IMPLANT
DRAPE EXTREMITY T 121X128X90 (DRAPE) ×4 IMPLANT
DRAPE SURG 17X23 STRL (DRAPES) ×4 IMPLANT
DRSG KUZMA FLUFF (GAUZE/BANDAGES/DRESSINGS) ×2 IMPLANT
DRSG PAD ABDOMINAL 8X10 ST (GAUZE/BANDAGES/DRESSINGS) ×2 IMPLANT
GAUZE SPONGE 4X4 12PLY STRL LF (GAUZE/BANDAGES/DRESSINGS) ×3 IMPLANT
GAUZE XEROFORM 1X8 LF (GAUZE/BANDAGES/DRESSINGS) ×3 IMPLANT
GLOVE BIO SURGEON STRL SZ 6.5 (GLOVE) ×2 IMPLANT
GLOVE BIOGEL PI IND STRL 7.0 (GLOVE) IMPLANT
GLOVE BIOGEL PI IND STRL 8.5 (GLOVE) ×2 IMPLANT
GLOVE BIOGEL PI INDICATOR 7.0 (GLOVE) ×3
GLOVE BIOGEL PI INDICATOR 8.5 (GLOVE) ×1
GLOVE ECLIPSE 6.5 STRL STRAW (GLOVE) ×1 IMPLANT
GLOVE SURG ORTHO 8.0 STRL STRW (GLOVE) ×3 IMPLANT
GOWN BRE IMP PREV XXLGXLNG (GOWN DISPOSABLE) ×3 IMPLANT
GOWN PREVENTION PLUS XLARGE (GOWN DISPOSABLE) ×3 IMPLANT
NEEDLE 27GAX1X1/2 (NEEDLE) ×3 IMPLANT
NS IRRIG 1000ML POUR BTL (IV SOLUTION) ×3 IMPLANT
PACK BASIN DAY SURGERY FS (CUSTOM PROCEDURE TRAY) ×3 IMPLANT
PAD CAST 3X4 CTTN HI CHSV (CAST SUPPLIES) ×2 IMPLANT
PADDING CAST ABS 4INX4YD NS (CAST SUPPLIES)
PADDING CAST ABS COTTON 4X4 ST (CAST SUPPLIES) ×2 IMPLANT
PADDING CAST COTTON 3X4 STRL (CAST SUPPLIES)
SPONGE GAUZE 4X4 12PLY (GAUZE/BANDAGES/DRESSINGS) ×3 IMPLANT
STOCKINETTE 4X48 STRL (DRAPES) ×4 IMPLANT
SUT VICRYL 4-0 PS2 18IN ABS (SUTURE) IMPLANT
SUT VICRYL RAPIDE 4/0 PS 2 (SUTURE) ×4 IMPLANT
SYR BULB 3OZ (MISCELLANEOUS) ×3 IMPLANT
SYR CONTROL 10ML LL (SYRINGE) ×3 IMPLANT
TOWEL OR 17X24 6PK STRL BLUE (TOWEL DISPOSABLE) ×6 IMPLANT
UNDERPAD 30X30 INCONTINENT (UNDERPADS AND DIAPERS) ×4 IMPLANT

## 2013-03-31 NOTE — Op Note (Signed)
Dictation Number [id number]

## 2013-03-31 NOTE — Anesthesia Procedure Notes (Signed)
Procedure Name: LMA Insertion Date/Time: 03/31/2013 10:01 AM Performed by: Burna Cash Pre-anesthesia Checklist: Patient identified, Emergency Drugs available, Suction available and Patient being monitored Patient Re-evaluated:Patient Re-evaluated prior to inductionOxygen Delivery Method: Circle System Utilized Preoxygenation: Pre-oxygenation with 100% oxygen Intubation Type: IV induction Ventilation: Mask ventilation without difficulty LMA: LMA inserted LMA Size: 4.0 Number of attempts: 1 Airway Equipment and Method: bite block Placement Confirmation: positive ETCO2 Tube secured with: Tape Dental Injury: Teeth and Oropharynx as per pre-operative assessment

## 2013-03-31 NOTE — Brief Op Note (Signed)
03/31/2013  11:21 AM  PATIENT:  Erin Watts  57 y.o. female  PRE-OPERATIVE DIAGNOSIS:  BILATERAL CARPAL TUNNEL SYNDROME AND BILATERAL STENOSING TENOSYNOVITIS MIDDLE FINGERS  POST-OPERATIVE DIAGNOSIS:  BILATERAL CARPAL TUNNEL SYNDROME AND BILATERAL STENOSING TENOSYNOVITIS MIDDLE FINGERS, MASS RIGHT INDEX FINGER  PROCEDURE:  Procedure(s): BILATERAL CARPAL TUNNEL RELEASE (Bilateral) BILATERAL MIDDLE FINGER A-1 PULLEY RELEASE, EXCISION MASS RIGHT INDEX FINGER (Bilateral)  SURGEON:  Surgeon(s) and Role:    * Nicki Reaper, MD - Primary  PHYSICIAN ASSISTANT:   ASSISTANTS: none   ANESTHESIA:   local and general  EBL:  Total I/O In: 1000 [I.V.:1000] Out: -   BLOOD ADMINISTERED:none  DRAINS: none   LOCAL MEDICATIONS USED:  MARCAINE     SPECIMEN:  Excision  DISPOSITION OF SPECIMEN:  PATHOLOGY  COUNTS:  YES  TOURNIQUET:   Total Tourniquet Time Documented: Forearm (N/A) - 36 minutes Forearm (N/A) - 22 minutes Total: Forearm (N/A) - 58 minutes   DICTATION: .Other Dictation: Dictation Number [id number]  PLAN OF CARE: Discharge to home after PACU  PATIENT DISPOSITION:  PACU - hemodynamically stable.
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
## 2013-03-31 NOTE — Transfer of Care (Signed)
Immediate Anesthesia Transfer of Care Note  Patient: Erin Watts  Procedure(s) Performed: Procedure(s) (LRB): BILATERAL CARPAL TUNNEL RELEASE (Bilateral) BILATERAL MIDDLE FINGER A-1 PULLEY RELEASE, EXCISION MASS RIGHT INDEX FINGER (Bilateral)  Patient Location: PACU  Anesthesia Type: General  Level of Consciousness: awake, alert  and oriented  Airway & Oxygen Therapy: Patient Spontanous Breathing and Patient connected to face mask oxygen  Post-op Assessment: Report given to PACU RN and Post -op Vital signs reviewed and stable  Post vital signs: Reviewed and stable  Complications: No apparent anesthesia complications

## 2013-03-31 NOTE — Anesthesia Preprocedure Evaluation (Signed)
Anesthesia Evaluation  Patient identified by MRN, date of birth, ID band Patient awake    Reviewed: Allergy & Precautions, H&P , NPO status , Patient's Chart, lab work & pertinent test results  Airway Mallampati: II TM Distance: >3 FB Neck ROM: Full    Dental  (+) Teeth Intact and Dental Advisory Given   Pulmonary  breath sounds clear to auscultation        Cardiovascular hypertension, Pt. on medications Rhythm:Regular Rate:Normal     Neuro/Psych    GI/Hepatic   Endo/Other  diabetes, Well Controlled, Type 2, Insulin DependentMorbid obesity  Renal/GU      Musculoskeletal   Abdominal   Peds  Hematology   Anesthesia Other Findings   Reproductive/Obstetrics                           Anesthesia Physical Anesthesia Plan  ASA: III  Anesthesia Plan: General   Post-op Pain Management:    Induction: Intravenous  Airway Management Planned: LMA  Additional Equipment:   Intra-op Plan:   Post-operative Plan: Extubation in OR  Informed Consent: I have reviewed the patients History and Physical, chart, labs and discussed the procedure including the risks, benefits and alternatives for the proposed anesthesia with the patient or authorized representative who has indicated his/her understanding and acceptance.   Dental advisory given  Plan Discussed with: CRNA, Anesthesiologist and Surgeon  Anesthesia Plan Comments:         Anesthesia Quick Evaluation

## 2013-03-31 NOTE — Anesthesia Postprocedure Evaluation (Signed)
  Anesthesia Post-op Note  Patient: Erin Watts  Procedure(s) Performed: Procedure(s): BILATERAL CARPAL TUNNEL RELEASE (Bilateral) BILATERAL MIDDLE FINGER A-1 PULLEY RELEASE, EXCISION MASS RIGHT INDEX FINGER (Bilateral)  Patient Location: PACU  Anesthesia Type:General  Level of Consciousness: awake, alert  and oriented  Airway and Oxygen Therapy: Patient Spontanous Breathing and Patient connected to face mask oxygen  Post-op Pain: mild  Post-op Assessment: Post-op Vital signs reviewed  Post-op Vital Signs: Reviewed  Complications: No apparent anesthesia complications

## 2013-04-01 NOTE — Op Note (Signed)
Erin Watts, BROWNLEY                ACCOUNT NO.:  [id number]  MEDICAL RECORD NO.:  [id number]  LOCATION:                                 FACILITY:  PHYSICIAN:  Erin Watts, M.D.       DATE OF BIRTH:  [birth year]  DATE OF PROCEDURE:  03/31/2013 DATE OF DISCHARGE:  03/31/2013                              OPERATIVE REPORT   PREOPERATIVE DIAGNOSES:  Bilateral carpal tunnel syndrome, bilateral stenosing tenosynovitis, middle fingers; mass, right index finger PIP joint.  POSTOPERATIVE DIAGNOSES:  Bilateral carpal tunnel syndrome, bilateral stenosing tenosynovitis, middle fingers; mass, right index finger PIP joint.  OPERATION:  Bilateral carpal tunnel release; bilateral trigger finger release, middle fingers; excision of mass, right index finger.  SURGEON:  Erin Watts, M.D.  ANESTHESIA:  General with local infiltration.  ANESTHESIOLOGIST:  Erin Silvan, MD  HISTORY:  The patient is a 57 year old female with history of bilateral carpal tunnel syndrome, nerve conductions positive, bilateral triggering of her middle fingers and a mass on the dorsal aspect radial side proximal PIP area of her right index finger.  She is desirous of surgical release of both sides at the same time with excision of the mass.  Pre, peri, and postoperative course have been discussed along with risks and complications.  She is aware that there is no guarantee with the surgery; possibility of infection; recurrence of injury to arteries, nerves, tendons; incomplete relief of symptoms, dystrophy.  In the preoperative area, the patient is seen, the extremity marked by both the patient and surgeon.  Antibiotic given.  PROCEDURE IN DETAIL:  The patient was brought to the operating room. General anesthetic was carried out without difficulty.  She was prepped using ChloraPrep, supine position with the right arm free.  First, the left arm was prepped midway through the procedure.  The right arm was attended to 1st.   This was exsanguinated with an Esmarch bandage. Tourniquet placed on forearm was inflated to 250 mmHg.  An oblique incision was made over the A1 pulley of the right middle finger carried down through subcutaneous tissue.  Bleeders were electrocauterized with bipolar.  Retractors placed protecting neurovascular bundles radially and ulnarly the radial side of the A1 pulley.  An incision was made. This was extended to the central aspect to the A2 pulley, a flexor sheath cyst was excised.  A partial tenosynovectomy performed proximally.  This released the 2 flexor tendons, full flexion and extension revealed no further triggering.  The wound was irrigated and closed with subcuticular 4-0 Vicryl Rapide suture.  The carpal tunnel syndrome was attended.  Next, longitudinal incision made in the right palm, carried down through subcutaneous tissue.  Bleeders were electrocauterized.  Palmar fascia was split.  Superficial palmar arch identified.  Flexor tendon of the ring and little finger identified to the ulnar side of median nerve.  The carpal retinaculum was incised with sharp dissection.  Right angle and Sewall retractor placed between skin and forearm fascia.  Fascia was released for approximately a centimeter and half proximal to the wrist crease under direct vision.  Canal was explored.  Motor branch entered into muscle.  Area compression to the nerve was apparent.  No further lesions were identified.  The wound was irrigated.  The skin closed with subcuticular 4-0 Vicryl Rapide.  A separate incision was then made over the dorsal radial aspect of the index finger PIP joint carried down through subcutaneous tissue. Bleeders again electrocauterized.  A cyst was immediately encountered. This was followed into the PIP joint which was opened.  The area debrided and synovectomy performed.  The specimen sent to Pathology. The wound was irrigated.  The skin closed with a subcuticular 4-0  Vicryl Rapide suture.  Dermabond was placed on each of the wounds.  Each was then injected with 0.25% Marcaine without epinephrine.  Approximately 9 mL was used.  Metacarpal block was given for the index finger.  Sterile compressive dressing was applied.  On deflation of the tourniquet, all fingers immediately pinked.  The left side was attended to next.  After prepping and draping, 2nd time-out was taken.  The limb exsanguinated with an Esmarch bandage.  Tourniquet placed on the forearm inflated to 250 mmHg.  The A1 pulley of the middle finger was attended to 1st. Oblique incision made carried down through subcutaneous tissue. Bleeders were again electrocauterized with bipolar.  Retractors placed protecting neurovascular bundles.  The A1 pulley was released on its radial aspect along with an incision in A2.  Partial tenosynovectomy performed proximally.  The filling were placed through full range motion.  No further triggering was noted.  The wound was irrigated and closed with a subcuticular 4-0 Vicryl Rapide suture.  A separate longitudinal incision was then made on the right palm carried down through subcutaneous tissue.  Bleeders again electrocauterized.  Palmar fascia was split.  Superficial palmar arch identified.  The flexor tendon to the ring little finger identified to the ulnar side of the median nerve.  The carpal retinaculum was incised with sharp dissection. Right angle and Sewall retractor were placed between skin and forearm fascia.  The fascia released for approximately a centimeter and half proximal to the wrist crease under direct vision.  Canal was explored. Again compression to the nerve was apparent.  No further lesions were identified.  The motor branch entered into muscle.  The wound was irrigated and closed with a 4-0 subcuticular Vicryl Rapide sutures. Dermabond again applied.  Local infiltration with 0.25% Marcaine without epinephrine was given, 7 mL was used.   Sterile compressive dressing with fingers free was applied.  On deflation of the tourniquet, all fingers immediately pinked.  She was taken to the recovery room for observation in satisfactory condition.  She will be discharged home to return in 1 week on Norco.          ______________________________ Erin Watts, M.D.     GK/MEDQ  D:  03/31/2013  T:  04/01/2013  Job:  [id number]

## 2013-04-02 ENCOUNTER — Encounter (HOSPITAL_BASED_OUTPATIENT_CLINIC_OR_DEPARTMENT_OTHER): Payer: Self-pay | Admitting: Orthopedic Surgery

## 2016-06-27 DIAGNOSIS — E782 Mixed hyperlipidemia: Secondary | ICD-10-CM | POA: Insufficient documentation

## 2016-06-27 DIAGNOSIS — E669 Obesity, unspecified: Secondary | ICD-10-CM | POA: Insufficient documentation

## 2016-06-27 DIAGNOSIS — E039 Hypothyroidism, unspecified: Secondary | ICD-10-CM | POA: Insufficient documentation

## 2016-06-27 DIAGNOSIS — Z794 Long term (current) use of insulin: Secondary | ICD-10-CM | POA: Insufficient documentation

## 2016-06-27 DIAGNOSIS — E559 Vitamin D deficiency, unspecified: Secondary | ICD-10-CM | POA: Insufficient documentation

## 2016-09-27 DIAGNOSIS — E538 Deficiency of other specified B group vitamins: Secondary | ICD-10-CM | POA: Insufficient documentation

## 2017-02-19 DIAGNOSIS — Z86010 Personal history of colonic polyps: Secondary | ICD-10-CM | POA: Insufficient documentation

## 2017-03-25 ENCOUNTER — Other Ambulatory Visit (HOSPITAL_COMMUNITY): Payer: Self-pay | Admitting: Gastroenterology

## 2017-03-25 DIAGNOSIS — R1013 Epigastric pain: Secondary | ICD-10-CM

## 2017-04-10 ENCOUNTER — Ambulatory Visit (HOSPITAL_COMMUNITY): Payer: Commercial Managed Care - PPO

## 2017-04-22 ENCOUNTER — Ambulatory Visit (HOSPITAL_COMMUNITY)
Admission: RE | Admit: 2017-04-22 | Discharge: 2017-04-22 | Disposition: A | Discharge status: Home / Self Care | Payer: Commercial Managed Care - PPO | Source: Ambulatory Visit | Attending: Gastroenterology | Admitting: Gastroenterology

## 2017-04-22 ENCOUNTER — Other Ambulatory Visit (HOSPITAL_COMMUNITY): Payer: Self-pay | Admitting: Gastroenterology

## 2017-04-22 DIAGNOSIS — R1013 Epigastric pain: Secondary | ICD-10-CM

## 2017-04-22 DIAGNOSIS — K449 Diaphragmatic hernia without obstruction or gangrene: Secondary | ICD-10-CM | POA: Diagnosis not present

## 2017-04-22 LAB — POCT I-STAT CREATININE: CREATININE: 1 mg/dL (ref 0.44–1.00)

## 2017-04-22 MED ORDER — IOPAMIDOL (ISOVUE-300) INJECTION 61%
100.0000 mL | Freq: Once | INTRAVENOUS | Status: AC | PRN
  Administered 2017-04-22: 100 mL via INTRAVENOUS

## 2017-05-17 DIAGNOSIS — R1013 Epigastric pain: Secondary | ICD-10-CM | POA: Insufficient documentation

## 2017-12-01 ENCOUNTER — Ambulatory Visit: Payer: Self-pay | Admitting: Podiatry

## 2019-06-01 IMAGING — CT CT ABDOMEN WO/W CM
3 of 10 series · 11 of 46 positions shown, 17 images · IV contrast (Isovue)
Comparison: None.

CLINICAL DATA: Epigastric pain for 6-8 month

EXAM:
CT ABDOMEN WITHOUT AND WITH CONTRAST
TECHNIQUE: Multidetector CT imaging of the abdomen was performed following the
standard protocol before and following the bolus administration of
intravenous contrast.
CONTRAST:  100mL [id number] IOPAMIDOL (JXN83N-[id number]) INJECTION 61%

[Series 3: axial arterial · axial · arterial · 0.75mm/px · z∈[-507,-468]mm · 2 of 93 slices shown]
[im 14/93  soft-tissue]
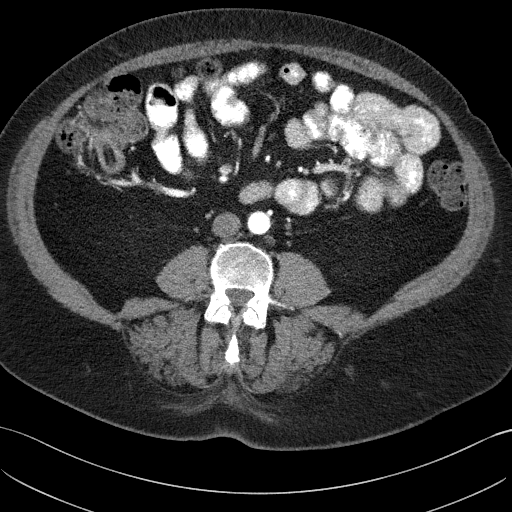
[im 27/93  soft-tissue]
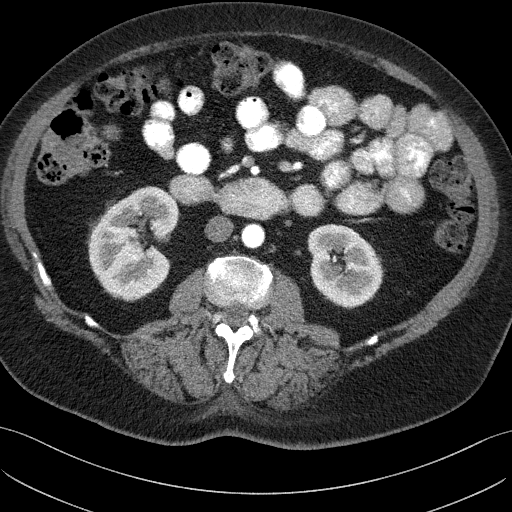

[Series 4: axial venous · axial · portal-venous · 0.75mm/px · z∈[-507,-312]mm · 6 of 93 slices shown, 11 images]
[im 14/93  soft-tissue]
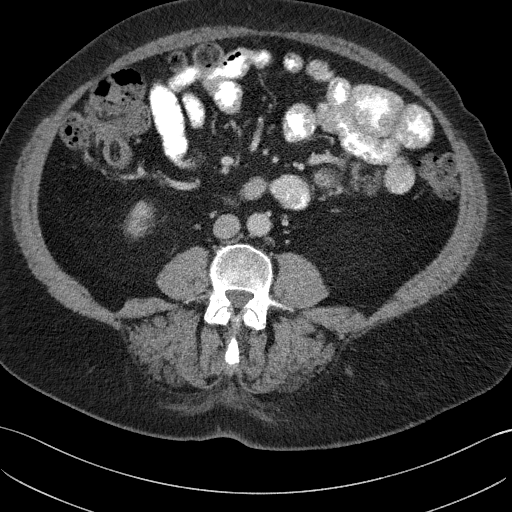
[im 14/93  bone]
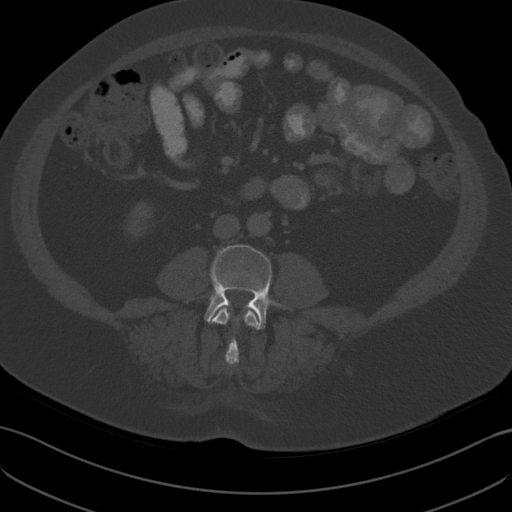
[im 27/93  soft-tissue]
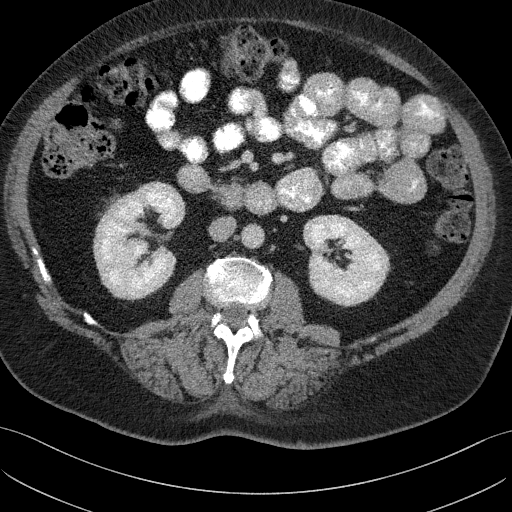
[im 40/93  soft-tissue]
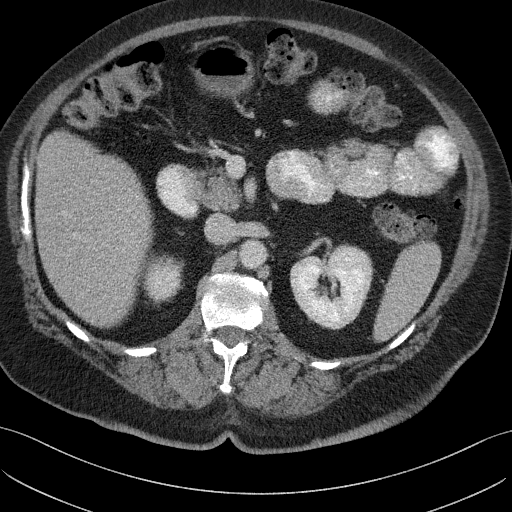
[im 40/93  lung]
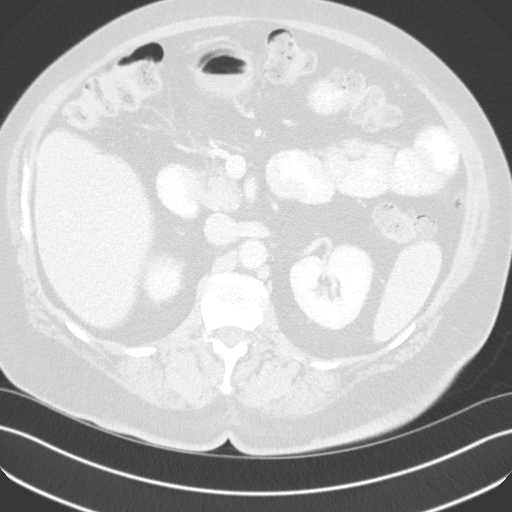
[im 53/93  soft-tissue]
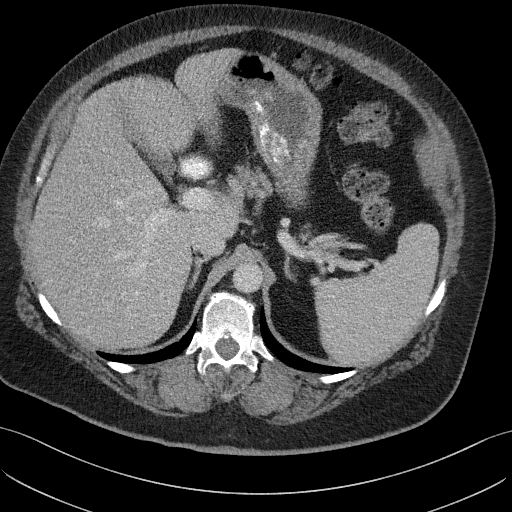
[im 53/93  lung]
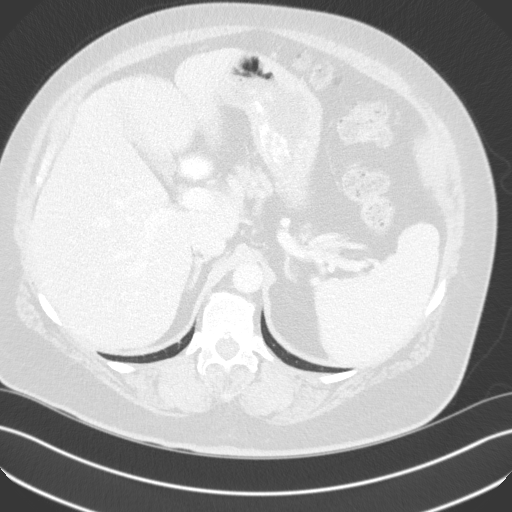
[im 66/93  soft-tissue]
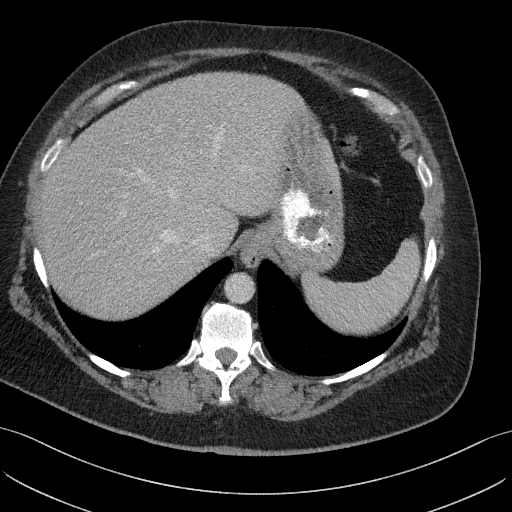
[im 66/93  lung]
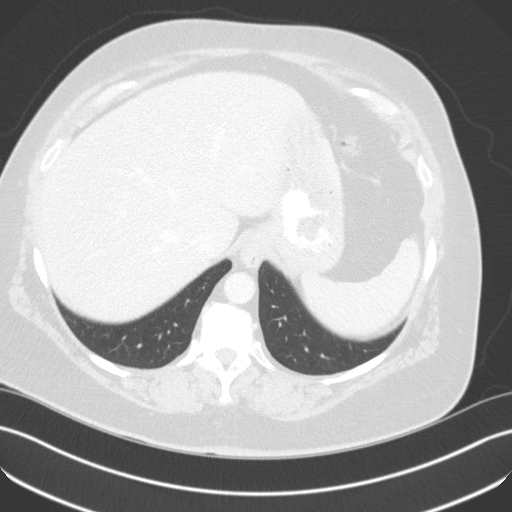
[im 79/93  soft-tissue]
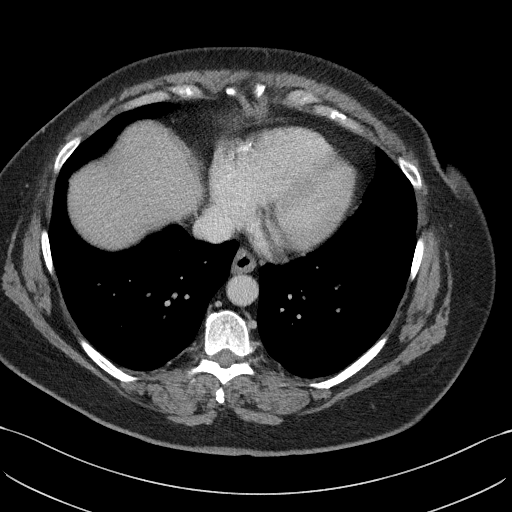
[im 79/93  lung]
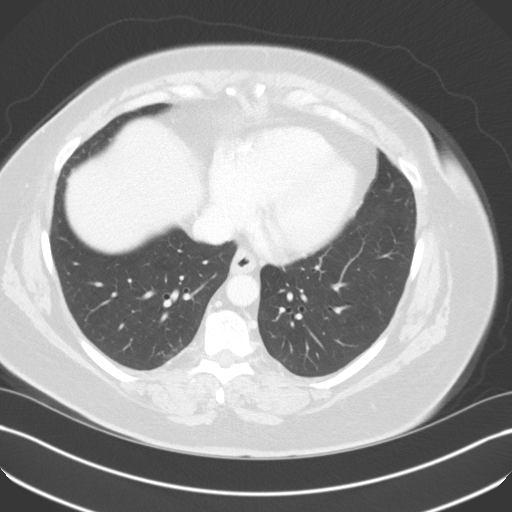

[Series 11: coronal arterial · coronal · arterial · 0.54mm/px · 3 of 113 slices shown, 4 images]
[im 29/113  soft-tissue]
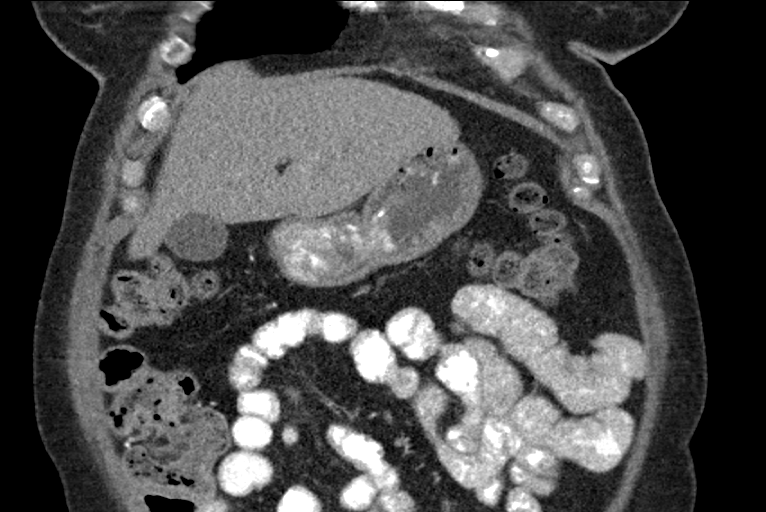
[im 57/113  soft-tissue]
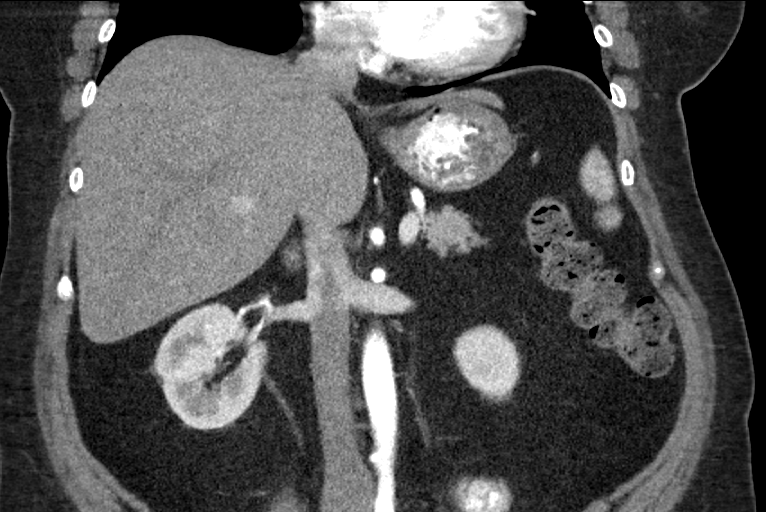
[im 57/113  bone]
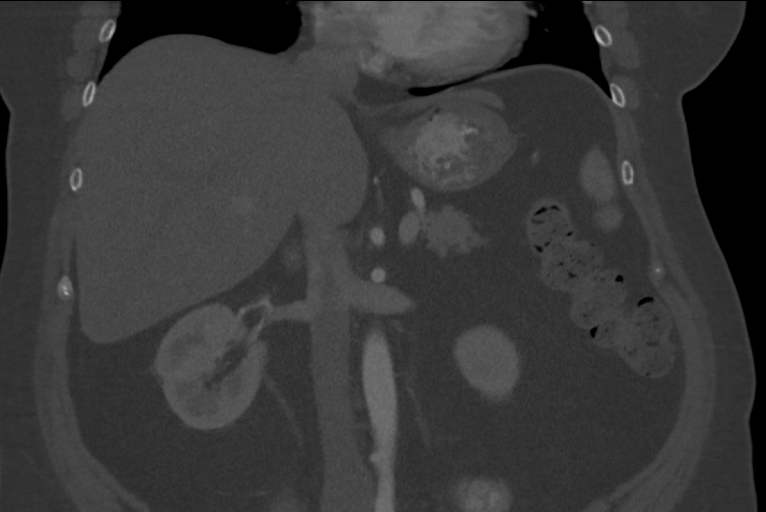
[im 85/113  soft-tissue]
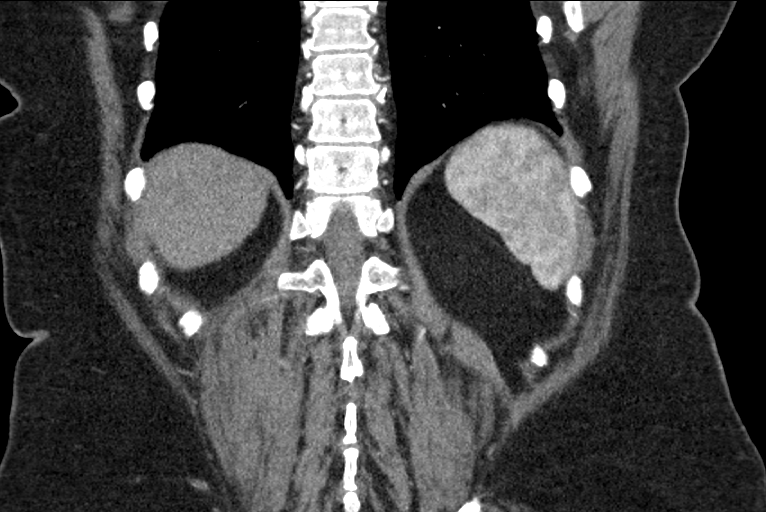

[11 of 46 positions shown; findings below may reference images not displayed]

FINDINGS: Lower chest: No acute abnormality.

Hepatobiliary: Hypertrophy of the caudate lobe of liver identified.
No focal liver abnormality is seen. No gallstones, gallbladder wall
thickening, or biliary dilatation.

Pancreas: Unremarkable. No pancreatic ductal dilatation or
surrounding inflammatory changes.

Spleen: Normal in size without focal abnormality.

Adrenals/Urinary Tract: The adrenal glands are normal. No kidney
stones or hydronephrosis identified. No mass.

Stomach/Bowel: Small hiatal hernia. Stomach is otherwise
unremarkable. No abnormal dilatation of the visualized abdominal
bowel loops.

Vascular/Lymphatic: Normal appearance of the abdominal aorta. No
aneurysm. The celiac and superior mesenteric artery these as well as
the IMA appear patent. No significant atherosclerotic disease
identified. The portal vein is patent.

Other: No free fluid or fluid collections.

Musculoskeletal: No acute or significant osseous findings.
IMPRESSION: 1. No acute findings and no explanation for patient's mid upper
abdominal pain.
2. No focal liver abnormality identified to correspond with the
abnormality noted on ultrasound.

## 2019-09-23 ENCOUNTER — Ambulatory Visit: Payer: Self-pay | Admitting: Podiatry

## 2019-09-28 ENCOUNTER — Other Ambulatory Visit: Payer: Self-pay

## 2019-09-28 ENCOUNTER — Ambulatory Visit (INDEPENDENT_AMBULATORY_CARE_PROVIDER_SITE_OTHER): Payer: Medicare HMO

## 2019-09-28 ENCOUNTER — Encounter: Payer: Self-pay | Admitting: Sports Medicine

## 2019-09-28 ENCOUNTER — Ambulatory Visit (INDEPENDENT_AMBULATORY_CARE_PROVIDER_SITE_OTHER): Payer: Medicare HMO | Admitting: Sports Medicine

## 2019-09-28 VITALS — BP 104/68 | HR 109 | Temp 97.8°F | Resp 16

## 2019-09-28 DIAGNOSIS — M2040 Other hammer toe(s) (acquired), unspecified foot: Secondary | ICD-10-CM

## 2019-09-28 DIAGNOSIS — M779 Enthesopathy, unspecified: Secondary | ICD-10-CM | POA: Diagnosis not present

## 2019-09-28 DIAGNOSIS — Z794 Long term (current) use of insulin: Secondary | ICD-10-CM

## 2019-09-28 DIAGNOSIS — B351 Tinea unguium: Secondary | ICD-10-CM

## 2019-09-28 DIAGNOSIS — E1165 Type 2 diabetes mellitus with hyperglycemia: Secondary | ICD-10-CM | POA: Diagnosis not present

## 2019-09-28 DIAGNOSIS — M79674 Pain in right toe(s): Secondary | ICD-10-CM

## 2019-09-28 DIAGNOSIS — M7742 Metatarsalgia, left foot: Secondary | ICD-10-CM

## 2019-09-28 DIAGNOSIS — M79672 Pain in left foot: Secondary | ICD-10-CM

## 2019-09-28 DIAGNOSIS — M778 Other enthesopathies, not elsewhere classified: Secondary | ICD-10-CM | POA: Diagnosis not present

## 2019-09-28 DIAGNOSIS — M79675 Pain in left toe(s): Secondary | ICD-10-CM

## 2019-09-28 DIAGNOSIS — E1141 Type 2 diabetes mellitus with diabetic mononeuropathy: Secondary | ICD-10-CM

## 2019-09-28 MED ORDER — NEOMYCIN-POLYMYXIN-HC 3.5-10000-1 OT SOLN: OTIC | 0 refills | Status: AC

## 2019-09-28 NOTE — Progress Notes (Signed)
Subjective: Erin Watts is a 64 y.o. female patient who presents to office for evaluation of left foot pain. Patient complains of progressive pain especially over the last 6 months burning pain to 2nd toe and pain to top of foot that radiates to toes 1-2 on left and has noticed the bump on the top of foot getting bigger over last 2 years. 2nd toe hurts most feels like its on fire. Denies injury/trip/fall/sprain/any causative factors. Admits to nail issues at big toes had nails removed twice last time 5 years ago but still hurts sometimes and still has grown back with bleeding recently randomly at right toenail bed. Patient denies any injury or trauma to right 1st toenail. No other issues noted.  Review of Systems  All other systems reviewed and are negative.    Patient Active Problem List   Diagnosis Date Noted  . Epigastric pain 05/17/2017  . History of adenomatous polyp of colon 02/19/2017  . Vitamin B12 deficiency 09/27/2016  . Mixed dyslipidemia 06/27/2016  . Obesity, Class II, BMI 35-39.9 06/27/2016  . Primary hypothyroidism 06/27/2016  . Type 2 diabetes mellitus with hyperglycemia, with long-term current use of insulin ([place]) 06/27/2016  . Vitamin D deficiency 06/27/2016  . HYPERLIPIDEMIA-MIXED 07/27/2009  . HYPERTENSION, UNSPECIFIED 07/27/2009  . CHEST PAIN, EXERTIONAL 07/27/2009    Current Outpatient Medications on File Prior to Visit  Medication Sig Dispense Refill  . Cholecalciferol 125 MCG (5000 UT) TABS Take 1 tablet once a day    . cyanocobalamin 1000 MCG tablet Take 1 tablet once a day    . fluconazole (DIFLUCAN) 100 MG tablet TAKE 1 TABLET BY MOUTH DAILY FOR 3 DAYS    . furosemide (LASIX) 20 MG tablet Take 20 mg by mouth as needed.     Marland Kitchen glimepiride (AMARYL) 4 MG tablet glimepiride 4 mg tablet    . hydrochlorothiazide (HYDRODIURIL) 25 MG tablet Take 25 mg by mouth daily.    Marland Kitchen HYDROcodone-acetaminophen (NORCO) 5-325 MG per tablet Take 1 tablet by mouth every 6 (six)  hours as needed for moderate pain. 30 tablet 0  . insulin regular (NOVOLIN R,HUMULIN R) 100 units/mL injection Inject into the skin 3 (three) times daily before meals. 22u am 22 u pm 12u hs    . lansoprazole (PREVACID) 30 MG capsule Take 30 mg by mouth daily.    Marland Kitchen levothyroxine (SYNTHROID) 75 MCG tablet Take 1 tablet in AM on empty stomach once a day for thyroid (name brand only DAW)    . metFORMIN (GLUCOPHAGE-XR) 500 MG 24 hr tablet metformin ER 500 mg tablet,extended release 24 hr    . nitrofurantoin, macrocrystal-monohydrate, (MACROBID) 100 MG capsule nitrofurantoin monohydrate/macrocrystals 100 mg capsule    . ramipril (ALTACE) 10 MG capsule Take 10 mg by mouth daily.    . rosuvastatin (CRESTOR) 10 MG tablet Take 10 mg by mouth daily.    Nelva Nay SOLOSTAR 300 UNIT/ML Solostar Pen INJECT 30 UNITS EVERY MORNING. MAY ADJUST BY TWO UNITS EVERY FOUR DAYS BY TARGETING BLOOD SUGARS MOSTLY UNDER 140 BEFORE BREAKFAST     No current facility-administered medications on file prior to visit.    Allergies  Allergen Reactions  . Nateglinide Nausea Only    Objective:  General: Alert and oriented x3 in no acute distress  Dermatology: No open lesions bilateral lower extremities, no webspace macerations, no ecchymosis bilateral, bilateral hallux nails with splinter fragments and dry blood at right hallux nail bed.  Vascular: Dorsalis Pedis and Posterior Tibial pedal pulses palpable,  Capillary Fill Time 3 seconds,(+) pedal hair growth bilateral, no edema bilateral lower extremities, Temperature gradient within normal limits.  Neurology: Gross sensation intact via light touch bilateral, + burning sensation to left 2nd toe.   Musculoskeletal: Mild tenderness with palpation at dorsal midfoot with palpable bone spur on left, + hammertoe, + fat pad atrophy.Strength within normal limits in all groups bilateral.   Gait: Mildly Antalgic gait  Xrays  Left Foot   Impression:small bone spur at distal tuft of  hallux, severe midfoot arthritis and heel spur. Mild hammertoe.   Assessment and Plan: Problem List Items Addressed This Visit      Endocrine   Type 2 diabetes mellitus with hyperglycemia, with long-term current use of insulin (HCC)   Relevant Medications   TOUJEO SOLOSTAR 300 UNIT/ML Solostar Pen   glimepiride (AMARYL) 4 MG tablet   metFORMIN (GLUCOPHAGE-XR) 500 MG 24 hr tablet    Other Visit Diagnoses    Left foot pain    -  Primary   Relevant Orders   DG Foot Complete Left   Bone spur       Capsulitis       Pain due to onychomycosis of toenails of both feet       Relevant Medications   fluconazole (DIFLUCAN) 100 MG tablet   nitrofurantoin, macrocrystal-monohydrate, (MACROBID) 100 MG capsule   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)       Relevant Medications   TOUJEO SOLOSTAR 300 UNIT/ML Solostar Pen   glimepiride (AMARYL) 4 MG tablet   metFORMIN (GLUCOPHAGE-XR) 500 MG 24 hr tablet   Metatarsalgia of left foot       Hammer toe, unspecified laterality           -Complete examination performed -Xrays reviewed -Discussed treatement options -Patient declined steriod injection at this time at top of left foot or at 2nd toe on left -Advised patient that burning pain could be related to neuritis mechanical vs diabetic -Recommend good supportive shoes and met padding as dispensed -At no charge debrided hallux nail/smoothed fragments and Rx corticosporin to use for the next 2 weeks -Advised patient if nails continue to be a problem may benefit from OR removal and resection of spur at nail bed -Patient to return to office in 1 month or sooner if condition worsens.  Landis Martins, DPM

## 2019-09-29 ENCOUNTER — Telehealth: Payer: Self-pay | Admitting: *Deleted

## 2019-09-29 NOTE — Telephone Encounter (Signed)
Message left that I would inform Dr. Marylene Land and call again with more information.

## 2019-09-29 NOTE — Telephone Encounter (Signed)
Polysporin

## 2019-09-29 NOTE — Telephone Encounter (Signed)
Pt states she can not afford the drops and would like an alternative.

## 2019-09-30 NOTE — Telephone Encounter (Signed)
Pt returned call after 5:00pm. I informed pt Dr. Marylene Land had recommended polysporin.

## 2019-10-07 ENCOUNTER — Other Ambulatory Visit: Payer: Self-pay | Admitting: Sports Medicine

## 2019-10-07 DIAGNOSIS — M2040 Other hammer toe(s) (acquired), unspecified foot: Secondary | ICD-10-CM

## 2019-10-07 DIAGNOSIS — M779 Enthesopathy, unspecified: Secondary | ICD-10-CM

## 2019-10-26 ENCOUNTER — Ambulatory Visit: Payer: Medicare HMO | Admitting: Sports Medicine

## 2020-04-18 ENCOUNTER — Encounter: Payer: Commercial Managed Care - PPO | Admitting: Medical

## 2021-01-30 ENCOUNTER — Ambulatory Visit: Payer: Self-pay | Admitting: Dermatology

## 2021-05-31 ENCOUNTER — Ambulatory Visit: Payer: Medicare HMO | Admitting: Sports Medicine

## 2021-05-31 ENCOUNTER — Encounter: Payer: Self-pay | Admitting: Sports Medicine

## 2021-05-31 ENCOUNTER — Other Ambulatory Visit: Payer: Self-pay

## 2021-05-31 DIAGNOSIS — E1165 Type 2 diabetes mellitus with hyperglycemia: Secondary | ICD-10-CM | POA: Diagnosis not present

## 2021-05-31 DIAGNOSIS — Z794 Long term (current) use of insulin: Secondary | ICD-10-CM | POA: Diagnosis not present

## 2021-05-31 DIAGNOSIS — G629 Polyneuropathy, unspecified: Secondary | ICD-10-CM | POA: Diagnosis not present

## 2021-05-31 MED ORDER — GABAPENTIN 300 MG PO CAPS: 300.0000 mg | ORAL_CAPSULE | Freq: Every day | ORAL | 3 refills | Status: AC

## 2021-05-31 NOTE — Progress Notes (Signed)
Subjective: Erin Watts is a 66 y.o. 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  Patient is diabetic with last blood sugar was 62 last A1c of 8.5 which is down from 9.7.  Patient denies any known injury but does admit to a history in addition to the diabetes of thyroid problems and vitamin B deficiency.  Admits recent diagnosis of bursitis and physical therapy of the right hip.  No other pedal complaints noted.  Patient Active Problem List   Diagnosis Date Noted   Epigastric pain 05/17/2017   History of adenomatous polyp of colon 02/19/2017   Vitamin B12 deficiency 09/27/2016   Mixed dyslipidemia 06/27/2016   Obesity, Class II, BMI 35-39.9 06/27/2016   Primary hypothyroidism 06/27/2016   Type 2 diabetes mellitus with hyperglycemia, with long-term current use of insulin (HCC) 06/27/2016   Vitamin D deficiency 06/27/2016   HYPERLIPIDEMIA-MIXED 07/27/2009   HYPERTENSION, UNSPECIFIED 07/27/2009   CHEST PAIN, EXERTIONAL 07/27/2009   Current Outpatient Medications on File Prior to Visit  Medication Sig Dispense Refill   Cholecalciferol 125 MCG (5000 UT) TABS Take 1 tablet once a day     cyanocobalamin 1000 MCG tablet Take 1 tablet once a day     fluconazole (DIFLUCAN) 100 MG tablet TAKE 1 TABLET BY MOUTH DAILY FOR 3 DAYS     furosemide (LASIX) 20 MG tablet Take 20 mg by mouth as needed.      glimepiride (AMARYL) 4 MG tablet glimepiride 4 mg tablet     hydrochlorothiazide (HYDRODIURIL) 25 MG tablet Take 25 mg by mouth daily.     HYDROcodone-acetaminophen (NORCO) 5-325 MG per tablet Take 1 tablet by mouth every 6 (six) hours as needed for moderate pain. 30 tablet 0   insulin regular (NOVOLIN R,HUMULIN R) 100 units/mL  injection Inject into the skin 3 (three) times daily before meals. 22u am 22 u pm 12u hs     lansoprazole (PREVACID) 30 MG capsule Take 30 mg by mouth daily.     levothyroxine (SYNTHROID) 75 MCG tablet Take 1 tablet in AM on empty stomach once a day for thyroid (name brand only DAW)     metFORMIN (GLUCOPHAGE-XR) 500 MG 24 hr tablet metformin ER 500 mg tablet,extended release 24 hr     neomycin-polymyxin-hydrocortisone (CORTISPORIN) OTIC solution Use 1-2 drops to nails daily for the next two weeks 10 mL 0   nitrofurantoin, macrocrystal-monohydrate, (MACROBID) 100 MG capsule nitrofurantoin monohydrate/macrocrystals 100 mg capsule     ramipril (ALTACE) 10 MG capsule Take 10 mg by mouth daily.     rosuvastatin (CRESTOR) 10 MG tablet Take 10 mg by mouth daily.     TOUJEO SOLOSTAR 300 UNIT/ML Solostar Pen INJECT 30 UNITS EVERY MORNING. MAY ADJUST BY TWO UNITS EVERY FOUR DAYS BY TARGETING BLOOD SUGARS MOSTLY UNDER 140 BEFORE BREAKFAST     No current facility-administered medications on file prior to visit.   Allergies  Allergen Reactions   Nateglinide Nausea Only    No results found for this or any previous visit (from the past 2160 hour(s)).  Objective: General: Patient is awake, alert, and oriented x 3 and in no acute distress.  Integument: Skin is warm, dry and supple bilateral. Nails are t short  and minimally thickened -5 bilateral. No signs of infection. No open lesions or preulcerative lesions present bilateral. Remaining integument unremarkable.  Vasculature:  Dorsalis Pedis pulse 1/4 bilateral. Posterior Tibial pulse 1/4 bilateral.  Capillary fill time <3 sec 1-5 bilateral.  No hair growth to the level of the digits. Temperature gradient within normal limits.  Mild varicosities present bilateral.  Trace edema present bilateral.   Neurology: The patient has intact sensation measured with a 5.07/10g Semmes Weinstein Monofilament at all pedal sites bilateral . Vibratory sensation diminished  bilateral with tuning fork with concern of likely small fiber neuropathy. No Babinski sign present bilateral.   Musculoskeletal: Asymptomatic hammertoe, bunion and pes planus deformity pedal deformities noted bilateral. Muscular strength 5/5 in all lower extremity muscular groups bilateral without pain on range of motion . No tenderness with calf compression bilateral.  Assessment and Plan: Problem List Items Addressed This Visit       Endocrine   Type 2 diabetes mellitus with hyperglycemia, with long-term current use of insulin (HCC)   Other Visit Diagnoses     Neuropathy    -  Primary       -Examined patient. -Discussed and educated patient on the symptoms of neuropathy and the importance of good glycemic control and the detriment of not  controlling glucose levels in relation to the foot. -Rx gabapentin 300 mg at bedtime and advised patient if this works well we will continue with his medication if no improvement after 4 to 6 weeks to notify office for [place] to send a referral to neurology -Answered all patient questions -Patient to return if symptoms fail to continue to improve -Patient advised to call the office if any problems or questions arise in the meantime.  Asencion Islam, DPM

## 2021-07-04 ENCOUNTER — Telehealth: Payer: Self-pay | Admitting: *Deleted

## 2021-07-04 NOTE — Telephone Encounter (Signed)
Patient is calling to left the physician know that the medicine prescribed for Neuropathy did help some but is still having some burning and pain. Please advise.

## 2021-07-05 NOTE — Telephone Encounter (Signed)
Called patient giving physician's recommendations, verbalized understanding instructions.

## 2021-07-16 ENCOUNTER — Other Ambulatory Visit: Payer: Self-pay | Admitting: Sports Medicine

## 2021-07-16 ENCOUNTER — Telehealth: Payer: Self-pay | Admitting: *Deleted

## 2021-07-16 DIAGNOSIS — G629 Polyneuropathy, unspecified: Secondary | ICD-10-CM

## 2021-07-16 DIAGNOSIS — E1165 Type 2 diabetes mellitus with hyperglycemia: Secondary | ICD-10-CM

## 2021-07-16 NOTE — Telephone Encounter (Signed)
Patient is calling because the double medication prescribed for neuropathy in feet is not helping, only helped the first day, still burning and pain. "What direction do I need to go?"Please advise.

## 2021-07-16 NOTE — Progress Notes (Signed)
Referral placed to [facility] for burning pain in feet Not relieved with Gabapentin -Dr. Nat Christen

## 2021-07-17 ENCOUNTER — Encounter: Payer: Self-pay | Admitting: Neurology

## 2021-07-17 NOTE — Telephone Encounter (Signed)
Called the patient giving recommendations/information, verbalized understanding and said that she is already up to twice a day with the Gabapentin, has been scheduled w/ [facility], (April appointment).

## 2021-09-07 ENCOUNTER — Ambulatory Visit: Payer: Medicare HMO | Admitting: Neurology

## 2022-01-08 ENCOUNTER — Ambulatory Visit (HOSPITAL_BASED_OUTPATIENT_CLINIC_OR_DEPARTMENT_OTHER)
Admission: RE | Admit: 2022-01-08 | Discharge: 2022-01-08 | Disposition: A | Discharge status: Home / Self Care | Payer: Medicare HMO | Source: Ambulatory Visit | Attending: Internal Medicine | Admitting: Internal Medicine

## 2022-01-08 ENCOUNTER — Other Ambulatory Visit: Payer: Self-pay | Admitting: Internal Medicine

## 2022-01-08 ENCOUNTER — Other Ambulatory Visit (HOSPITAL_BASED_OUTPATIENT_CLINIC_OR_DEPARTMENT_OTHER): Payer: Self-pay | Admitting: Internal Medicine

## 2022-01-08 DIAGNOSIS — R221 Localized swelling, mass and lump, neck: Secondary | ICD-10-CM | POA: Insufficient documentation

## 2022-09-23 ENCOUNTER — Ambulatory Visit (INDEPENDENT_AMBULATORY_CARE_PROVIDER_SITE_OTHER): Payer: Medicare HMO

## 2022-09-23 ENCOUNTER — Ambulatory Visit: Payer: Medicare HMO | Admitting: Podiatry

## 2022-09-23 DIAGNOSIS — M7752 Other enthesopathy of left foot: Secondary | ICD-10-CM

## 2022-09-23 DIAGNOSIS — M79675 Pain in left toe(s): Secondary | ICD-10-CM

## 2022-09-23 MED ORDER — TRIAMCINOLONE ACETONIDE 10 MG/ML IJ SUSP: 10.0000 mg | Freq: Once | INTRAMUSCULAR | Status: AC

## 2022-09-24 ENCOUNTER — Other Ambulatory Visit: Payer: Self-pay | Admitting: Podiatry

## 2022-09-24 DIAGNOSIS — M79675 Pain in left toe(s): Secondary | ICD-10-CM

## 2022-09-24 DIAGNOSIS — M7752 Other enthesopathy of left foot: Secondary | ICD-10-CM

## 2022-09-24 NOTE — Progress Notes (Signed)
Subjective:   Patient ID: Erin Watts, female   DOB: 67 y.o.   MRN: [id number]   HPI Patient presents with a lot of discomfort around the first MPJ of the left foot with fluid buildup around the surface and that this is been getting worse recently   ROS      Objective:  Physical Exam  Neurovascular status intact muscle strength found to be adequate inflammation with moderate fluid around the first MPJ left with mild to moderate structural deformity     Assessment:  Structural bunion deformity left with inflammation and moderate structural changes with the pain     Plan:  Combination of inflammatory capsulitis structural deformity noted and today I went ahead did H&P reviewed x-rays sterile prep injected around the first MPJ 3 mg Dexasone Kenalog 5 mg Xylocaine and she will check her sugar more carefully for the next day.  Patient will be seen back as symptoms indicate may require more aggressive treatment  X-rays indicate mild to moderate deformity around the first MPJ with no other pathology
